# Patient Record
Sex: Male | Born: 1995 | Race: Black or African American | Hispanic: No | Marital: Single | Smoking: Never smoker
Health system: Southern US, Community
[De-identification: ages and names within clinical notes are randomized; demographics above are authoritative.]

---
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
## 2015-11-10 ENCOUNTER — Encounter (HOSPITAL_COMMUNITY): Payer: Self-pay

## 2015-11-10 ENCOUNTER — Emergency Department (HOSPITAL_COMMUNITY)
Admission: EM | Admit: 2015-11-10 | Discharge: 2015-11-11 | Disposition: A | Discharge status: Home / Self Care | Payer: BC Managed Care – PPO | Attending: Emergency Medicine | Admitting: Emergency Medicine

## 2015-11-10 DIAGNOSIS — Z79899 Other long term (current) drug therapy: Secondary | ICD-10-CM | POA: Diagnosis not present

## 2015-11-10 DIAGNOSIS — Z5181 Encounter for therapeutic drug level monitoring: Secondary | ICD-10-CM | POA: Insufficient documentation

## 2015-11-10 DIAGNOSIS — R45851 Suicidal ideations: Secondary | ICD-10-CM | POA: Diagnosis not present

## 2015-11-10 DIAGNOSIS — F331 Major depressive disorder, recurrent, moderate: Secondary | ICD-10-CM

## 2015-11-10 DIAGNOSIS — F329 Major depressive disorder, single episode, unspecified: Secondary | ICD-10-CM | POA: Diagnosis present

## 2015-11-10 DIAGNOSIS — F32A Depression, unspecified: Secondary | ICD-10-CM

## 2015-11-10 LAB — COMPREHENSIVE METABOLIC PANEL
ALBUMIN: 4.6 g/dL (ref 3.5–5.0)
ALK PHOS: 58 U/L (ref 38–126)
ALT: 14 U/L — ABNORMAL LOW (ref 17–63)
ANION GAP: 8 (ref 5–15)
AST: 21 U/L (ref 15–41)
BILIRUBIN TOTAL: 0.4 mg/dL (ref 0.3–1.2)
BUN: 11 mg/dL (ref 6–20)
CALCIUM: 9.8 mg/dL (ref 8.9–10.3)
CO2: 26 mmol/L (ref 22–32)
Chloride: 105 mmol/L (ref 101–111)
Creatinine, Ser: 0.85 mg/dL (ref 0.61–1.24)
GFR calc Af Amer: >60 mL/min (ref >60)
GFR calc non Af Amer: >60 mL/min (ref >60)
GLUCOSE: 97 mg/dL (ref 65–99)
POTASSIUM: 3.6 mmol/L (ref 3.5–5.1)
SODIUM: 139 mmol/L (ref 135–145)
Total Protein: 7.5 g/dL (ref 6.5–8.1)

## 2015-11-10 LAB — CBC
HEMATOCRIT: 40.8 % (ref 39.0–52.0)
HEMOGLOBIN: 13.5 g/dL (ref 13.0–17.0)
MCH: 26.4 pg (ref 26.0–34.0)
MCHC: 33.1 g/dL (ref 30.0–36.0)
MCV: 79.7 fL (ref 78.0–100.0)
Platelets: 222 10*3/uL (ref 150–400)
RBC: 5.12 MIL/uL (ref 4.22–5.81)
RDW: 13.2 % (ref 11.5–15.5)
WBC: 7.3 10*3/uL (ref 4.0–10.5)

## 2015-11-10 LAB — SALICYLATE LEVEL: Salicylate Lvl: <7 mg/dL (ref 2.8–30.0)

## 2015-11-10 LAB — ACETAMINOPHEN LEVEL

## 2015-11-10 LAB — ETHANOL: Alcohol, Ethyl (B): <5 mg/dL (ref <5)

## 2015-11-10 NOTE — ED Triage Notes (Signed)
Pt states "I haven't been feeling good about myself." Pt states recent depression. Pt admits to SI. Pt expressing feelings of "deserving to hurt self." Pt states increasing depression x 1 year.

## 2015-11-11 DIAGNOSIS — Z79899 Other long term (current) drug therapy: Secondary | ICD-10-CM

## 2015-11-11 DIAGNOSIS — F331 Major depressive disorder, recurrent, moderate: Secondary | ICD-10-CM

## 2015-11-11 LAB — RAPID URINE DRUG SCREEN, HOSP PERFORMED
AMPHETAMINES: NOT DETECTED
BARBITURATES: NOT DETECTED
BENZODIAZEPINES: NOT DETECTED
COCAINE: NOT DETECTED
Opiates: NOT DETECTED
TETRAHYDROCANNABINOL: NOT DETECTED

## 2015-11-11 MED ORDER — IBUPROFEN 400 MG PO TABS: 600.0000 mg | ORAL_TABLET | Freq: Three times a day (TID) | ORAL | Status: DC | PRN

## 2015-11-11 NOTE — ED Provider Notes (Signed)
[facility]-EMERGENCY DEPT Provider Note   CSN: [id number] Arrival date & time: 11/10/15  2222     History   Chief Complaint Chief Complaint  Patient presents with  . Suicidal    HPI Gordon Hill is a 20 y.o. male with A history of asthma, eczema and seasonal allergies presents to the Emergency Department complaining of gradual, persistent, progressively worsening depression onset one year ago.  She reports that gradually over the last several months he has become more and more depressed. He reports that recently he began to feel hopeless. He states he felt as if his life would be better if he wasn't living anymore however he does not think that he is suicidal. He states he thought about driving to the beach and drowning in the ocean. No suicide attempt. Patient denies auditory or visual hallucinations, homicidal ideations.  He denies previous hospitalizations for psychiatric complaints. He has been seeing a counselor for the last 2 months without improvement. He has never been on any psychiatric medications.  Patient denies smoking, drug or alcohol usage. He denies specific events aggravating his suicidal ideation. He is here voluntarily. He denies all somatic symptoms.    The history is provided by the patient and medical records. No language interpreter was used.    History reviewed. No pertinent past medical history.  There are no active problems to display for this patient.   History reviewed. No pertinent surgical history.     Home Medications    Prior to Admission medications   Medication Sig Start Date End Date Taking? Authorizing Provider  cetirizine (ZYRTEC) 10 MG tablet Take 10 mg by mouth daily.   Yes Historical Provider, MD  hydrocortisone cream 1 % Apply 1 application topically 2 (two) times daily.   Yes Historical Provider, MD    Family History History reviewed. No pertinent family history.  Social History Social History  Substance Use Topics  . Smoking status:  Never Smoker  . Smokeless tobacco: Never Used  . Alcohol use No     Allergies   Review of patient's allergies indicates no known allergies.   Review of Systems Review of Systems  Psychiatric/Behavioral: Positive for dysphoric mood and suicidal ideas.  All other systems reviewed and are negative.    Physical Exam Updated Vital Signs BP 121/71 (BP Location: Left Arm)   Pulse 73   Temp 98.5 F (36.9 C) (Oral)   Resp 16   Ht 5\' 11"  (1.803 m)   Wt 59.2 kg   SpO2 100%   BMI 18.20 kg/m   Physical Exam  Constitutional: He appears well-developed and well-nourished. No distress.  Awake, alert, nontoxic appearance  HENT:  Head: Normocephalic and atraumatic.  Mouth/Throat: Oropharynx is clear and moist. No oropharyngeal exudate.  Eyes: Conjunctivae are normal. No scleral icterus.  Neck: Normal range of motion. Neck supple.  Cardiovascular: Normal rate, regular rhythm and intact distal pulses.   Pulmonary/Chest: Effort normal and breath sounds normal. No respiratory distress. He has no wheezes.  Equal chest expansion  Abdominal: Soft. Bowel sounds are normal. He exhibits no mass. There is no tenderness. There is no rebound and no guarding.  Musculoskeletal: Normal range of motion. He exhibits no edema.  Neurological: He is alert.  Speech is clear and goal oriented Moves extremities without ataxia  Skin: Skin is warm and dry. He is not diaphoretic.  Psychiatric: He exhibits a depressed mood. He expresses suicidal ideation. He expresses no homicidal ideation. He expresses suicidal plans. He expresses no  homicidal plans.  Nursing note and vitals reviewed.    ED Treatments / Results  Labs (all labs ordered are listed, but only abnormal results are displayed) Labs Reviewed  COMPREHENSIVE METABOLIC PANEL - Abnormal; Notable for the following:       Result Value   ALT 14 (*)    All other components within normal limits  ACETAMINOPHEN LEVEL - Abnormal; Notable for the  following:    Acetaminophen (Tylenol), Serum <10 (*)    All other components within normal limits  ETHANOL  SALICYLATE LEVEL  CBC  RAPID URINE DRUG SCREEN, HOSP PERFORMED    Procedures Procedures (including critical care time)  Medications Ordered in ED Medications  ibuprofen (ADVIL,MOTRIN) tablet 600 mg (not administered)     Initial Impression / Assessment and Plan / ED Course  I have reviewed the triage vital signs and the nursing notes.  Pertinent labs & imaging results that were available during my care of the patient were reviewed by me and considered in my medical decision making (see chart for details).  Clinical Course  Comment By Time  TTS recommends eval by psychiatry in the AM [facility], PA-C 10/21 0054  Labs reassuring Dierdre Forth, PA-C 10/21 1610    Discussed with forward from [facility] age who requests that patient be evaluated by psychiatry in the morning. On discussion with patient and family. They are frustrated by this. I however have advised them that the patient is here voluntarily and if he attempts to leave IVC paperwork will be initiated. They're in agreement with staying.  At shift change, care transferred to Will Dansie and Frederick Peers pending Psychiatric evaluation.    Final Clinical Impressions(s) / ED Diagnoses   Final diagnoses:  Suicidal ideation    New Prescriptions New Prescriptions   No medications on file     Dierdre Forth, PA-C 11/11/15 [id number]    Shaune Pollack, MD 11/11/15 1546

## 2015-11-11 NOTE — ED Notes (Signed)
Pt states he is a vegetarian; fruit provided. Pt ate.

## 2015-11-11 NOTE — Consult Note (Signed)
Telepsych Consultation   Reason for Consult:  Depressive symptoms  Referring Physician: EDP Patient Identification: Gordon Hill MRN:  [id number] Principal Diagnosis: MDD (major depressive disorder), recurrent episode, moderate ([place]) Diagnosis:   Patient Active Problem List   Diagnosis Date Noted  . MDD (major depressive disorder), recurrent episode, moderate ([place]) [F33.1] 11/11/2015    Total Time spent with patient: 20 minutes  Subjective:   Gordon Hill is a 20 y.o. male patient admitted with depressive symptoms worsening over this year.   HPI:     Per initial Tele Assessment Note in am of 11/11/2015:   Gordon Hill is an 20 y.o. single male who presents to [facility] accompanied by his mother, Dee Maday [phone], who participated in assessment at Pt's request. Pt reports he agreed to come to the ED because his mother recommended he be evaluated for depressive symptoms.  Pt reports he has a history of experiencing low self-esteem and has felt increasingly depressed over the past year. He was talking with his mother today and "I told her I felt like dying." Pt's mother reports she is a Education officer, museum and takes talk of suicide seriously and asked Pt to come to the ED for assessment. Pt denies current suicidal ideation and states he has never seriously considered suicide but "when I am low I sometimes don't feel I deserve to be alive." He denies any history of suicide attempts or intentional self-injurious behavior. Pt acknowledge symptoms including social withdrawal, decreased concentration, irritability and periodic feelings of hopelessness. Pt identifies his primary concern as "I care too much about what other people think." He states when he feels he has upset someone he feels very guilty and "I can't let it go." Pt also says he feels he takes life, and himself, too seriously. Pt denies current homicidal ideation or history of violence. Pt denies any history of psychotic symptoms.  Pt denies any history of alcohol or substance abuse.  Pt identifies his primary stressor as choosing a career path. Pt says he recently graduated from college and "I feel I'm at a crossroads." Pt is currently employed and denies any financial stress. He lives with his parents and identifies them as his primary support. Pt denies any family history of mental health or substance abuse problems. Pt denies any history of abuse or trauma.  Pt denies any history of inpatient mental health or substance abuse treatment. He reports he started seeing a therapist, Delma Officer with [facility], last month and has had two sessions so far. He says he has never been prescribed psychiatric medication.  Pt is dressed in hospital scrubs, alert, oriented x4 with normal speech and normal motor behavior. Eye contact is good. Pt's mood is depressed and affect is congruent with mood. Thought process is coherent and relevant. There is no indication Pt is currently responding to internal stimuli or experiencing delusional thought content. Pt was cooperative throughout assessment. Pt states he feels safe to go home, that he hasn't had any thoughts of harming himself. Pt's mother says she has no concerns that Pt will harm himself if he returns home.  Today during assessment with NP 11/11/2015 the patient was calm and cooperative. He admits to moderate depressive symptoms as described above but states "I never wanted to hurt myself. I'm just in a bad place. I thought that I would just be coming to talk to someone. I'm ready to go home. I see a therapist at [facility]. They have recommended  I see a Psychiatrist but I have not made an appointment yet. I will do that." Patient's mother Butch Penny who was present for assessment expressed concern that her son was held too long in the ED stating "I am the one who pushed him to come. I am a teacher and our protocol is to get someone help if they make a concerning  comment. I should have just let him get in touch with his therapist. Coming to the ED was not what I expected. I'm frustrated that we have been here this long. I have no concern that he will hurt himself. We do not have a family history of suicide and he has never hurt himself before." Patient denies any active suicidal ideation stating "My self esteem is low. But I have no plan to hurt myself. I have been sleeping and eating ok." At this time patient does not meet criteria for inpatient admission and is stable for outpatient follow-up.    Past Psychiatric History: MDD  Risk to Self: Suicidal Ideation: No Suicidal Intent: No Is patient at risk for suicide?: Yes Suicidal Plan?: No Access to Means: No What has been your use of drugs/alcohol within the last 12 months?: Pt denies  How many times?: 0 Other Self Harm Risks: None identified Triggers for Past Attempts: None known Intentional Self Injurious Behavior: None Risk to Others: Homicidal Ideation: No Thoughts of Harm to Others: No Current Homicidal Intent: No Current Homicidal Plan: No Access to Homicidal Means: No Identified Victim: None History of harm to others?: No Assessment of Violence: None Noted Violent Behavior Description: Pt denies history of violence Does patient have access to weapons?: No Criminal Charges Pending?: No Does patient have a court date: No Prior Inpatient Therapy: Prior Inpatient Therapy: No Prior Therapy Dates: NA Prior Therapy Facilty/Provider(s): NA Reason for Treatment: NA Prior Outpatient Therapy: Prior Outpatient Therapy: Yes Prior Therapy Dates: Current Prior Therapy Facilty/Provider(s): Delma Officer Reason for Treatment: Depression and anxiety Does patient have an ACCT team?: No Does patient have Intensive In-House Services?  : No Does patient have [facility] services? : No Does patient have P4CC services?: No  Past Medical History: History reviewed. No pertinent past medical history. History  reviewed. No pertinent surgical history. Family History: History reviewed. No pertinent family history. Family Psychiatric  History: Denies Social History:  History  Alcohol Use No     History  Drug Use No    Social History   Social History  . Marital status: Single    Spouse name: N/A  . Number of children: N/A  . Years of education: N/A   Social History Main Topics  . Smoking status: Never Smoker  . Smokeless tobacco: Never Used  . Alcohol use No  . Drug use: No  . Sexual activity: Not Asked   Other Topics Concern  . None   Social History Narrative  . None   Additional Social History:    Allergies:  No Known Allergies  Labs:  Results for orders placed or performed during the hospital encounter of 11/10/15 (from the past 48 hour(s))  Rapid urine drug screen (hospital performed)     Status: None   Collection Time: 11/10/15 10:40 PM  Result Value Ref Range   Opiates NONE DETECTED NONE DETECTED   Cocaine NONE DETECTED NONE DETECTED   Benzodiazepines NONE DETECTED NONE DETECTED   Amphetamines NONE DETECTED NONE DETECTED   Tetrahydrocannabinol NONE DETECTED NONE DETECTED   Barbiturates NONE DETECTED NONE DETECTED    Comment:  DRUG SCREEN FOR MEDICAL PURPOSES ONLY.  IF CONFIRMATION IS NEEDED FOR ANY PURPOSE, NOTIFY LAB WITHIN 5 DAYS.        LOWEST DETECTABLE LIMITS FOR URINE DRUG SCREEN Drug Class       Cutoff (ng/mL) Amphetamine      1000 Barbiturate      200 Benzodiazepine   876 Tricyclics       811 Opiates          300 Cocaine          300 THC              50   Comprehensive metabolic panel     Status: Abnormal   Collection Time: 11/10/15 10:52 PM  Result Value Ref Range   Sodium 139 135 - 145 mmol/L   Potassium 3.6 3.5 - 5.1 mmol/L   Chloride 105 101 - 111 mmol/L   CO2 26 22 - 32 mmol/L   Glucose, Bld 97 65 - 99 mg/dL   BUN 11 6 - 20 mg/dL   Creatinine, Ser 0.85 0.61 - 1.24 mg/dL   Calcium 9.8 8.9 - 10.3 mg/dL   Total Protein 7.5 6.5 -  8.1 g/dL   Albumin 4.6 3.5 - 5.0 g/dL   AST 21 15 - 41 U/L   ALT 14 (L) 17 - 63 U/L   Alkaline Phosphatase 58 38 - 126 U/L   Total Bilirubin 0.4 0.3 - 1.2 mg/dL   GFR calc non Af Amer >60 >60 mL/min   GFR calc Af Amer >60 >60 mL/min    Comment: (NOTE) The eGFR has been calculated using the CKD EPI equation. This calculation has not been validated in all clinical situations. eGFR's persistently <60 mL/min signify possible Chronic Kidney Disease.    Anion gap 8 5 - 15  Ethanol     Status: None   Collection Time: 11/10/15 10:52 PM  Result Value Ref Range   Alcohol, Ethyl (B) <5 <5 mg/dL    Comment:        LOWEST DETECTABLE LIMIT FOR SERUM ALCOHOL IS 5 mg/dL FOR MEDICAL PURPOSES ONLY   Salicylate level     Status: None   Collection Time: 11/10/15 10:52 PM  Result Value Ref Range   Salicylate Lvl <5.7 2.8 - 30.0 mg/dL  Acetaminophen level     Status: Abnormal   Collection Time: 11/10/15 10:52 PM  Result Value Ref Range   Acetaminophen (Tylenol), Serum <10 (L) 10 - 30 ug/mL    Comment:        THERAPEUTIC CONCENTRATIONS VARY SIGNIFICANTLY. A RANGE OF 10-30 ug/mL MAY BE AN EFFECTIVE CONCENTRATION FOR MANY PATIENTS. HOWEVER, SOME ARE BEST TREATED AT CONCENTRATIONS OUTSIDE THIS RANGE. ACETAMINOPHEN CONCENTRATIONS >150 ug/mL AT 4 HOURS AFTER INGESTION AND >50 ug/mL AT 12 HOURS AFTER INGESTION ARE OFTEN ASSOCIATED WITH TOXIC REACTIONS.   cbc     Status: None   Collection Time: 11/10/15 10:52 PM  Result Value Ref Range   WBC 7.3 4.0 - 10.5 K/uL   RBC 5.12 4.22 - 5.81 MIL/uL   Hemoglobin 13.5 13.0 - 17.0 g/dL   HCT 40.8 39.0 - 52.0 %   MCV 79.7 78.0 - 100.0 fL   MCH 26.4 26.0 - 34.0 pg   MCHC 33.1 30.0 - 36.0 g/dL   RDW 13.2 11.5 - 15.5 %   Platelets 222 150 - 400 K/uL    Current Facility-Administered Medications  Medication Dose Route Frequency Provider Last Rate Last Dose  . ibuprofen (ADVIL,MOTRIN) tablet  600 mg  600 mg Oral Q8H PRN Abigail Butts, PA-C        Current Outpatient Prescriptions  Medication Sig Dispense Refill  . cetirizine (ZYRTEC) 10 MG tablet Take 10 mg by mouth daily.    . hydrocortisone cream 1 % Apply 1 application topically 2 (two) times daily.      Musculoskeletal:  Unable to assess via camera   Psychiatric Specialty Exam: Physical Exam  Review of Systems  Psychiatric/Behavioral: Positive for depression. Negative for hallucinations, memory loss, substance abuse and suicidal ideas. The patient is not nervous/anxious and does not have insomnia.     Blood pressure 117/73, pulse (!) 56, temperature 98.2 F (36.8 C), temperature source Oral, resp. rate 18, height _0  (1.803 m), weight 59.2 kg (130 lb 8 oz), SpO2 100 %.Body mass index is 18.2 kg/m.  General Appearance: Casual  Eye Contact:  Good  Speech:  Clear and Coherent  Volume:  Normal  Mood:  Anxious  Affect:  Appropriate  Thought Process:  Coherent and Goal Directed  Orientation:  Full (Time, Place, and Person)  Thought Content:  WDL  Suicidal Thoughts:  No  Homicidal Thoughts:  No  Memory:  Immediate;   Good Recent;   Good Remote;   Good  Judgement:  Fair  Insight:  Present  Psychomotor Activity:  Normal  Concentration:  Concentration: Good and Attention Span: Good  Recall:  Good  Fund of Knowledge:  Good  Language:  Good  Akathisia:  No  Handed:  Right  AIMS (if indicated):     Assets:  Communication Skills Desire for Improvement Housing Intimacy Leisure Time Physical Health Resilience Social Support Talents/Skills  ADL's:  Intact  Cognition:  WNL  Sleep:        Treatment Plan Summary: Mother and patient report that they will make a therapy appointment at [facility] in [place] and that they have been recommended to see a Psychiatrist in addition. They plan to follow up on the recommendation.   Disposition: No evidence of imminent risk to self or others at present.   Patient does not meet criteria for psychiatric  inpatient admission.  Elmarie Shiley, NP 11/11/2015 9:37 AM

## 2015-11-11 NOTE — BH Assessment (Addendum)
Tele Assessment Note   Gordon Hill is an 20 y.o. single male who presents to [facility] ED accompanied by his mother, Woodruff Skirvin [phone], who participated in assessment at Pt's request. Pt reports he agreed to come to the ED because his mother recommended he be evaluated for depressive symptoms.  Pt reports he has a history of experiencing low self-esteem and has felt increasingly depressed over the past year. He was talking with his mother today and "I told her I felt like dying." Pt's mother reports she is a Engineer, site and takes talk of suicide seriously and asked Pt to come to the ED for assessment. Pt denies current suicidal ideation and states he has never seriously considered suicide but "when I am low I sometimes don't feel I deserve to be alive." He denies any history of suicide attempts or intentional self-injurious behavior. Pt acknowledge symptoms including social withdrawal, decreased concentration, irritability and periodic feelings of hopelessness. Pt identifies his primary concern as "I care too much about what other people think." He states when he feels he has upset someone he feels very guilty and "I can't let it go." Pt also says he feels he takes life, and himself, too seriously. Pt denies current homicidal ideation or history of violence. Pt denies any history of psychotic symptoms. Pt denies any history of alcohol or substance abuse.  Pt identifies his primary stressor as choosing a career path. Pt says he recently graduated from college and "I feel I'm at a crossroads." Pt is currently employed and denies any financial stress. He lives with his parents and identifies them as his primary support. Pt denies any family history of mental health or substance abuse problems. Pt denies any history of abuse or trauma.  Pt denies any history of inpatient mental health or substance abuse treatment. He reports he started seeing a therapist, Maxwell Marion with [facility],  last month and has had two sessions so far. He says he has never been prescribed psychiatric medication.  Pt is dressed in hospital scrubs, alert, oriented x4 with normal speech and normal motor behavior. Eye contact is good. Pt's mood is depressed and affect is congruent with mood. Thought process is coherent and relevant. There is no indication Pt is currently responding to internal stimuli or experiencing delusional thought content. Pt was cooperative throughout assessment. Pt states he feels safe to go home, that he hasn't had any thoughts of harming himself. Pt's mother says she has no concerns that Pt will harm himself if he returns home.   Diagnosis: Major Depressive Disorder, Moderate  Past Medical History: History reviewed. No pertinent past medical history.  History reviewed. No pertinent surgical history.  Family History: History reviewed. No pertinent family history.  Social History:  reports that he has never smoked. He has never used smokeless tobacco. He reports that he does not drink alcohol or use drugs.  Additional Social History:  Alcohol / Drug Use Pain Medications: None Prescriptions: See MAR Over the Counter: See MAR History of alcohol / drug use?: No history of alcohol / drug abuse Longest period of sobriety (when/how long): NA  CIWA: CIWA-Ar BP: 121/71 Pulse Rate: 73 COWS:    PATIENT STRENGTHS: (choose at least two) Ability for insight Average or above average intelligence Capable of independent living Metallurgist fund of knowledge Motivation for treatment/growth Physical Health Supportive family/friends Work skills  Allergies: No Known Allergies  Home Medications:  (Not in a hospital admission)  OB/GYN Status:  No LMP for male patient.  General Assessment Data Location of Assessment: [facility] ED TTS Assessment: In system Is this a Tele or Face-to-Face Assessment?: Tele Assessment Is this an Initial Assessment or a  Re-assessment for this encounter?: Initial Assessment Marital status: Single Maiden name: NA Is patient pregnant?: No Pregnancy Status: No Living Arrangements: Parent (Mother and father) Can pt return to current living arrangement?: Yes Admission Status: Voluntary Is patient capable of signing voluntary admission?: Yes Referral Source: Self/Family/Friend Insurance type: BCBS     Crisis Care Plan Living Arrangements: Parent (Mother and father) Armed forces operational officer Guardian: Other: (Self) Name of Psychiatrist: None Name of Therapist: Agricultural engineer  Education Status Is patient currently in school?: No Current Grade: NA Highest grade of 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 : No Suicidal Intent: No Has patient had any suicidal intent within the past 6 months prior to admission? : No Is patient at risk for suicide?: Yes Suicidal Plan?: No Has patient had any suicidal plan within the past 6 months prior to admission? : No Access to Means: No What has been your use of drugs/alcohol within the last 12 months?: Pt denies  Previous Attempts/Gestures: No How many times?: 0 Other Self Harm Risks: None identified Triggers for Past Attempts: None known Intentional Self Injurious Behavior: None Family Suicide History: No Recent stressful life event(s): Other (Comment) (Graduated from college) Persecutory voices/beliefs?: No Depression: Yes Depression Symptoms: Loss of interest in usual pleasures, Feeling worthless/self pity, Fatigue, Guilt, Isolating, Despondent Substance abuse history and/or treatment for substance abuse?: No Suicide prevention information given to non-admitted patients: Yes  Risk to Others within the past 6 months Homicidal Ideation: No Does patient have any lifetime risk of violence toward  others beyond the six months prior to admission? : No Thoughts of Harm to Others: No Current Homicidal Intent: No Current Homicidal Plan: No Access to Homicidal Means: No Identified Victim: None History of harm to others?: No Assessment of Violence: None Noted Violent Behavior Description: Pt denies history of violence Does patient have access to weapons?: No Criminal Charges Pending?: No Does patient have a court date: No Is patient on probation?: No  Psychosis Hallucinations: None noted Delusions: None noted  Mental Status Report Appearance/Hygiene: In scrubs Eye Contact: Fair Motor Activity: Unremarkable Speech: Logical/coherent Level of Consciousness: Alert Mood: Depressed Affect: Depressed Anxiety Level: Moderate Thought Processes: Coherent, Relevant Judgement: Unimpaired Orientation: Person, Time, Place, Situation, Appropriate for developmental age Obsessive Compulsive Thoughts/Behaviors: None  Cognitive Functioning Concentration: Normal Memory: Recent Intact, Remote Intact IQ: Average Insight: Fair Impulse Control: Good Appetite: Good Weight Loss: 0 Weight Gain: 0 Sleep: No Change Total Hours of Sleep: 7 Vegetative Symptoms: None  ADLScreening [facility] Assessment Services) Patient's cognitive ability adequate to safely complete daily activities?: Yes Patient able to express need for assistance with ADLs?: Yes Independently performs ADLs?: Yes (appropriate for developmental age)  Prior Inpatient Therapy Prior Inpatient Therapy: No Prior Therapy Dates: NA Prior Therapy Facilty/Provider(s): NA Reason for Treatment: NA  Prior Outpatient Therapy Prior Outpatient Therapy: Yes Prior Therapy Dates: Current Prior Therapy Facilty/Provider(s): Maxwell Marion Reason for Treatment: Depression and anxiety Does patient have an ACCT team?: No Does patient have Intensive In-House Services?  : No Does patient have [facility] services? : No Does patient have P4CC  services?: No  ADL Screening (condition at time of admission) Patient's cognitive ability adequate to safely complete daily activities?: Yes Is the patient deaf or have difficulty hearing?: No Does the patient have difficulty seeing, even when wearing glasses/contacts?: No Does the patient have difficulty concentrating, remembering, or making decisions?: No Patient able to express need for assistance with ADLs?: Yes Does the patient have difficulty dressing or bathing?: No Independently performs ADLs?: Yes (appropriate for developmental age) Does the patient have difficulty walking or climbing stairs?: No Weakness of Legs: None Weakness of Arms/Hands: None       Abuse/Neglect Assessment (Assessment to be complete while patient is alone) Physical Abuse: Denies Verbal Abuse: Denies Sexual Abuse: Denies Exploitation of patient/patient's resources: Denies Self-Neglect: Denies     Merchant navy officerAdvance Directives (For Healthcare) Does patient have an advance directive?: No Would patient like information on creating an advanced directive?: No - patient declined information    Additional Information 1:1 In Past 12 Months?: No CIRT Risk: No Elopement Risk: No Does patient have medical clearance?: Yes     Disposition: Gave clinical report to Nira ConnJason Berry, NP who recommended Pt be evaluated by psychiatry in the morning. Notified Dierdre ForthHannah Muthersbaugh, PA-C and Windell Mouldinguth, RN of recommendation.  Disposition Initial Assessment Completed for this Encounter: Yes Disposition of Patient: Other dispositions Other disposition(s): Other (Comment)   Pamalee LeydenFord Ellis Lovella Hardie Jr, [facility], [facility], [facility] Triage Specialist [phone]   Patsy BaltimoreWarrick Jr, Harlin RainFord Ellis 11/11/2015 1:07 AM

## 2015-11-11 NOTE — ED Provider Notes (Signed)
Patient care assumed from [name], Gordon Hill at shift changes. Please see her note for further.  Briefly, at shift change patient is awaiting Behavioral Health consultation and disposition. Previous provider stated that if Behavioral Health was comfortable sending the patient home she was also comfortable with this. Behavioral Health assessed the patient and felt he was safe to be discharged home with outpatient follow-up. I my evaluation patient is resting comfortably in the room. He denies suicidal ideations. He is comfortable with discharge and will seek follow-up with his therapist. I discussed return precautions. I advised the patient to follow-up with their primary care provider this week. I advised the patient to return to the emergency department with new or worsening symptoms or new concerns. The patient verbalized understanding and agreement with plan.    Depression, unspecified depression type  Suicidal ideation     Gordon FarrierWilliam Masaichi Kracht, Gordon Hill 11/11/15 [id number]    Gordon Pollackameron Isaacs, Gordon Hill 11/11/15 [phone]

## 2015-11-11 NOTE — Discharge Instructions (Signed)
Behavioral Health resources:  Substance Abuse Treatment Programs  Intensive Outpatient Programs [facility]     [address]      [place], [place]                   [phone]       [facility] [address] [address] [place], [place] [phone]  [facility]     (Inpatient and outpatient)     [address]           [phone]    [facility] [phone] (Suboxone and Methadone)  [address]      [place], [place] [address]      [phone]       [address] [place], [place] [phone]  [facility] (Outpatient/Inpatient, Chemical)    (insurance only) [phone]             [facility] (Groups & Residential) [place], [place] [phone]     [facility]     [address]     [place], [place]      [phone]       [facility] (for caregivers and family) [phone] [address] [place], [place] [phone]      Residential Treatment Programs [facility]      [address] [place], [place] [address]  [phone]       [facility] [address] [place], [place] [address] [phone]  [facility] [place]        [phone]       [facility] [phone]  [facility] (Addiction Recovery Care [facility]             [address]                                         [place], [place]                                                [phone] or [phone]                               [facility] [address] [place], [address] [phone]  [facility]    [address]      [place], [place]     [phone]       [facility] (Outpatient Campus) [address] [place], [place] [phone]  [facility]   [address] [place], [place] [address]     [phone]      Admissions: 8am-3pm M-F  Residential Treatment Services (RTS) [address] [place], [place] [phone]  [facility] Program: Residential Program [phone] Days)   [place], [place]      [phone] or [phone]     ADATC: [facility] [place], [place] (Walk in Hours over the weekend or by referral)  [facility] [address] [place], [place], [place] [address] [phone]  Crisis Mobile: [facility]:  [phone] (for crisis response 24 hours a day) [facility] Hotline:      312-142-9898 Outpatient Psychiatry and Counseling  [facility]: Mobile Crisis Management 24 hours:  [phone]  [facility] sliding scale fee and walk in schedule: M-F 8am-12pm/1pm-3pm [address]  [place], [place] [address] [phone]   [facility] [address] [place], [place] [address] [phone]  [facility] (Formerly known as [facility])- new patient walk-in appointments available Monday - Friday 8am -3pm.          [address] [place], [place] [address] [phone] or crisis line- [phone]  [facility] Services/ Intensive Outpatient Therapy Program [address] [place], [place] [address] [phone]  [facility]                  Crisis Services      [phone]      [address]     [place], [place] [address]                 [facility]   [facility] [phone] [address] [place], [place] [address]   [facility]          [address] [place],  [place], [place] [address]       [phone]  [facility] [address] [place], [place] [address] [phone]  [facility]    [address]    [place], [place] [address]     [phone]       Gordon PolesParish McKinney, MD     [address]     [place] [address]     [phone]       [facility] [address] [place] [address]  [facility]     [address]     [place], [place]      [phone]       [facility] Eulogio DitchShamsher Ahluwalia, MD [address] [place], [place] [address] [phone]  [facility]     [address]     [place], [place] [address]     [phone]       [facility] [address] [place], [place] [address] [phone] Resources for Temporary Residential Assistance/Crisis Centers  [facility](IRC) M-F 8am-3pm   [address] [place], [place] [address]   [phone] Services include: laundry, barbering, support groups, case management, phone  & computer access, showers, AA/NA mtgs, mental health/substance abuse nurse, job skills class, disability information, VA assistance, spiritual classes, etc.   HOMELESS SHELTERS  [facility]     [facility]   [address], [place]     [phone]              [facility] (women and children)       [address] [place], [place] [address] [phone] [email or web address] for application and process Application Required  [facility]   [address]    [place] [address]     [phone]                    [facility] [address] [place], [place] [address] [phone](schedule application appt.) Application Required  [facility] (women only)    [address]     [place], [place] [address]     [phone]  Intake starts 6pm daily Need valid ID, SSC, & Police report [facility] [place] [address] [place], [place] [phone] Application Required  [facility] (men only)     [address]      [place], [place]     [phone]       [facility] (Pregnant women only) [address] [place],  [place] [phone]  [facility]      [address]      [place], [place] [address]     [phone]             [facility] [address] [place], [place] [phone] 90 day commitment/SA/Application process  [facility](men only)     [address]     [place], [place]     [phone]       Check-in at [facility]pm            [facility]is [facility] [address] [place], [place] [address] [phone] Men/Women/Women and Children must be there by 7 pm  [facility] [place], [place] [phone]

## 2015-11-11 NOTE — ED Notes (Signed)
Mom has pts cell phone and wallet to take home. Pt stuff gone through and placed in container.

## 2015-11-11 NOTE — ED Notes (Signed)
Mother and pt concerned about length of stay and process. RN went over process with pt and mom, both verbalized understanding. States they have outside resources. RN called [facility] to obtain time frame for assessment; AC unable to give one.

## 2015-11-11 NOTE — ED Notes (Signed)
TTS Anderson Cart 1 Set up at the bedside
